# Patient Record
Sex: Female | Born: 1955 | Race: Black or African American | Hispanic: No | Marital: Married | Smoking: Never smoker
Health system: Southern US, Community
[De-identification: ages and names within clinical notes are randomized; demographics above are authoritative.]

## PROBLEM LIST (undated history)

## (undated) DIAGNOSIS — K219 Gastro-esophageal reflux disease without esophagitis: Secondary | ICD-10-CM

## (undated) DIAGNOSIS — IMO0001 Reserved for inherently not codable concepts without codable children: Secondary | ICD-10-CM

## (undated) DIAGNOSIS — I10 Essential (primary) hypertension: Secondary | ICD-10-CM

## (undated) HISTORY — PX: BREAST LUMPECTOMY: SHX2

## (undated) HISTORY — PX: TUBAL LIGATION: SHX77

## (undated) HISTORY — PX: CHOLECYSTECTOMY: SHX55

---

## 2000-01-06 ENCOUNTER — Other Ambulatory Visit: Admission: RE | Admit: 2000-01-06 | Discharge: 2000-01-06 | Payer: Self-pay | Admitting: Family Medicine

## 2000-01-09 ENCOUNTER — Other Ambulatory Visit: Admission: RE | Admit: 2000-01-09 | Discharge: 2000-01-09 | Payer: Self-pay | Admitting: Obstetrics and Gynecology

## 2000-01-17 ENCOUNTER — Ambulatory Visit (HOSPITAL_COMMUNITY): Admission: RE | Admit: 2000-01-17 | Discharge: 2000-01-17 | Payer: Self-pay | Admitting: Obstetrics and Gynecology

## 2001-01-11 ENCOUNTER — Other Ambulatory Visit: Admission: RE | Admit: 2001-01-11 | Discharge: 2001-01-11 | Payer: Self-pay | Admitting: Obstetrics and Gynecology

## 2002-01-05 ENCOUNTER — Other Ambulatory Visit: Admission: RE | Admit: 2002-01-05 | Discharge: 2002-01-05 | Payer: Self-pay | Admitting: Obstetrics and Gynecology

## 2003-01-10 ENCOUNTER — Other Ambulatory Visit: Admission: RE | Admit: 2003-01-10 | Discharge: 2003-01-10 | Payer: Self-pay | Admitting: Obstetrics and Gynecology

## 2004-03-13 ENCOUNTER — Other Ambulatory Visit: Admission: RE | Admit: 2004-03-13 | Discharge: 2004-03-13 | Payer: Self-pay | Admitting: Internal Medicine

## 2004-04-02 ENCOUNTER — Encounter: Admission: RE | Admit: 2004-04-02 | Discharge: 2004-04-02 | Payer: Self-pay | Admitting: Internal Medicine

## 2006-04-16 ENCOUNTER — Encounter: Admission: RE | Admit: 2006-04-16 | Discharge: 2006-04-16 | Payer: Self-pay | Admitting: Internal Medicine

## 2006-05-01 ENCOUNTER — Other Ambulatory Visit: Admission: RE | Admit: 2006-05-01 | Discharge: 2006-05-01 | Payer: Self-pay | Admitting: Internal Medicine

## 2006-05-19 ENCOUNTER — Encounter: Admission: RE | Admit: 2006-05-19 | Discharge: 2006-05-19 | Payer: Self-pay | Admitting: Internal Medicine

## 2010-12-29 ENCOUNTER — Other Ambulatory Visit: Payer: Self-pay

## 2010-12-29 ENCOUNTER — Encounter: Payer: Self-pay | Admitting: Emergency Medicine

## 2010-12-29 ENCOUNTER — Emergency Department (HOSPITAL_BASED_OUTPATIENT_CLINIC_OR_DEPARTMENT_OTHER)
Admission: EM | Admit: 2010-12-29 | Discharge: 2010-12-29 | Disposition: A | Discharge status: Home / Self Care | Payer: BC Managed Care – PPO | Attending: Emergency Medicine | Admitting: Emergency Medicine

## 2010-12-29 DIAGNOSIS — I10 Essential (primary) hypertension: Secondary | ICD-10-CM | POA: Insufficient documentation

## 2010-12-29 DIAGNOSIS — K229 Disease of esophagus, unspecified: Secondary | ICD-10-CM | POA: Insufficient documentation

## 2010-12-29 DIAGNOSIS — R1310 Dysphagia, unspecified: Secondary | ICD-10-CM | POA: Insufficient documentation

## 2010-12-29 HISTORY — DX: Essential (primary) hypertension: I10

## 2010-12-29 MED ORDER — DICYCLOMINE HCL 20 MG PO TABS: 20.0000 mg | ORAL_TABLET | Freq: Two times a day (BID) | ORAL | Status: AC

## 2010-12-29 MED ORDER — GI COCKTAIL ~~LOC~~
30.0000 mL | Freq: Once | ORAL | Status: AC
  Administered 2010-12-29: 30 mL via ORAL
  Filled 2010-12-29: qty 30

## 2010-12-29 NOTE — ED Notes (Signed)
Pt c/o difficulty swallowing since yest; "feels like food is just sitting there"; denies vomiting & pain

## 2010-12-29 NOTE — ED Provider Notes (Signed)
History     CSN: [id number] Arrival date & time: 12/29/2010 11:05 AM  Chief Complaint  Patient presents with  . Dysphagia    (Consider location/radiation/quality/duration/timing/severity/associated sxs/prior treatment) The history is provided by the patient.   patient here with trouble swallowing since yesterday. Patient first noted symptoms when eating a hamburger, denies any actual vomiting is able to swallow her secretions appropriately. The patient denies any prior history of esophageal strictures or food impactions. No fever or diarrhea noted. No medications taken prior to arrival here. Patient feels like the food gets partially stuck about the mid esophageal area.  Past Medical History  Diagnosis Date  . Hypertension     Past Surgical History  Procedure Date  . Tubal ligation   . Breast lumpectomy   . Cholecystectomy     No family history on file.  History  Substance Use Topics  . Smoking status: Never Smoker   . Smokeless tobacco: Not on file  . Alcohol Use: Yes     social    OB History    Grav Para Term Preterm Abortions TAB SAB Ect Mult Living                  Review of Systems  All other systems reviewed and are negative.    Allergies  Review of patient's allergies indicates no known allergies.  Home Medications   Current Outpatient Rx  Name Route Sig Dispense Refill  . LISINOPRIL 10 MG PO TABS Oral Take 10 mg by mouth daily.        BP 143/70  Pulse 62  Temp(Src) 98.5 F (36.9 C) (Oral)  Resp 16  SpO2 100%  Physical Exam  Nursing note and vitals reviewed. Constitutional: She is oriented to person, place, and time. Vital signs are normal. She appears well-developed and well-nourished.  Non-toxic appearance. No distress.  HENT:  Head: Normocephalic and atraumatic.  Mouth/Throat: Uvula is midline and mucous membranes are normal. No oral lesions. No uvula swelling. No oropharyngeal exudate or tonsillar abscesses.  Eyes: Conjunctivae and EOM  are normal. Pupils are equal, round, and reactive to light.  Neck: Normal range of motion. Neck supple. No tracheal deviation present.  Cardiovascular: Normal rate, regular rhythm and normal heart sounds.  Exam reveals no gallop.   No murmur heard. Pulmonary/Chest: Effort normal and breath sounds normal. No stridor. No respiratory distress. She has no wheezes.  Abdominal: Soft. Normal appearance and bowel sounds are normal. She exhibits no distension. There is no tenderness. There is no rebound.  Musculoskeletal: Normal range of motion. She exhibits no edema and no tenderness.  Neurological: She is alert and oriented to person, place, and time. She has normal strength. No cranial nerve deficit or sensory deficit. GCS eye subscore is 4. GCS verbal subscore is 5. GCS motor subscore is 6.  Skin: Skin is warm and dry.  Psychiatric: She has a normal mood and affect. Her speech is normal and behavior is normal.    ED Course  Procedures (including critical care time)  Labs Reviewed - No data to display No results found.   No diagnosis found.    MDM   Date: 12/29/2010  Rate: 63  Rhythm: normal sinus rhythm  QRS Axis: normal  Intervals: normal  ST/T Wave abnormalities: non specific  Conduction Disutrbances:none  Narrative Interpretation:   Old EKG Reviewed: none available   Patient given GI cocktail with minimal relief of her symptoms. Patient states that she feels that when she swallows food  it takes a while for the food to make her stomach. No concern for ACS at this time appears to be a GI related issue. EKG did have some nonspecific T wave changes and patient will followup with her doctor for this       Toy Baker, MD 12/29/10 1236

## 2011-01-13 ENCOUNTER — Other Ambulatory Visit: Payer: Self-pay | Admitting: Obstetrics and Gynecology

## 2011-01-13 ENCOUNTER — Other Ambulatory Visit (HOSPITAL_COMMUNITY)
Admission: RE | Admit: 2011-01-13 | Discharge: 2011-01-13 | Disposition: A | Discharge status: Home / Self Care | Payer: BC Managed Care – PPO | Source: Ambulatory Visit | Attending: Obstetrics and Gynecology | Admitting: Obstetrics and Gynecology

## 2011-01-13 DIAGNOSIS — Z1159 Encounter for screening for other viral diseases: Secondary | ICD-10-CM | POA: Insufficient documentation

## 2011-01-13 DIAGNOSIS — Z124 Encounter for screening for malignant neoplasm of cervix: Secondary | ICD-10-CM | POA: Insufficient documentation

## 2012-02-16 ENCOUNTER — Other Ambulatory Visit (HOSPITAL_BASED_OUTPATIENT_CLINIC_OR_DEPARTMENT_OTHER): Payer: Self-pay | Admitting: Podiatry

## 2012-02-16 DIAGNOSIS — M722 Plantar fascial fibromatosis: Secondary | ICD-10-CM

## 2012-02-24 ENCOUNTER — Ambulatory Visit (HOSPITAL_BASED_OUTPATIENT_CLINIC_OR_DEPARTMENT_OTHER)
Admission: RE | Admit: 2012-02-24 | Discharge: 2012-02-24 | Disposition: A | Discharge status: Home / Self Care | Payer: BC Managed Care – PPO | Source: Ambulatory Visit | Attending: Podiatry | Admitting: Podiatry

## 2012-02-24 ENCOUNTER — Ambulatory Visit (HOSPITAL_BASED_OUTPATIENT_CLINIC_OR_DEPARTMENT_OTHER): Payer: BC Managed Care – PPO

## 2012-02-24 DIAGNOSIS — M19079 Primary osteoarthritis, unspecified ankle and foot: Secondary | ICD-10-CM | POA: Insufficient documentation

## 2012-02-24 DIAGNOSIS — M6580 Other synovitis and tenosynovitis, unspecified site: Secondary | ICD-10-CM | POA: Insufficient documentation

## 2012-02-24 DIAGNOSIS — M722 Plantar fascial fibromatosis: Secondary | ICD-10-CM | POA: Insufficient documentation

## 2012-02-24 DIAGNOSIS — M7730 Calcaneal spur, unspecified foot: Secondary | ICD-10-CM | POA: Insufficient documentation

## 2015-04-22 ENCOUNTER — Emergency Department (HOSPITAL_BASED_OUTPATIENT_CLINIC_OR_DEPARTMENT_OTHER)
Admission: EM | Admit: 2015-04-22 | Discharge: 2015-04-22 | Disposition: A | Discharge status: Home / Self Care | Payer: BLUE CROSS/BLUE SHIELD | Attending: Emergency Medicine | Admitting: Emergency Medicine

## 2015-04-22 ENCOUNTER — Encounter (HOSPITAL_BASED_OUTPATIENT_CLINIC_OR_DEPARTMENT_OTHER): Payer: Self-pay | Admitting: *Deleted

## 2015-04-22 ENCOUNTER — Emergency Department (HOSPITAL_BASED_OUTPATIENT_CLINIC_OR_DEPARTMENT_OTHER): Payer: BLUE CROSS/BLUE SHIELD

## 2015-04-22 DIAGNOSIS — Z792 Long term (current) use of antibiotics: Secondary | ICD-10-CM | POA: Insufficient documentation

## 2015-04-22 DIAGNOSIS — R52 Pain, unspecified: Secondary | ICD-10-CM

## 2015-04-22 DIAGNOSIS — N390 Urinary tract infection, site not specified: Secondary | ICD-10-CM

## 2015-04-22 DIAGNOSIS — R109 Unspecified abdominal pain: Secondary | ICD-10-CM | POA: Diagnosis present

## 2015-04-22 DIAGNOSIS — Z79899 Other long term (current) drug therapy: Secondary | ICD-10-CM | POA: Diagnosis not present

## 2015-04-22 DIAGNOSIS — I10 Essential (primary) hypertension: Secondary | ICD-10-CM | POA: Diagnosis not present

## 2015-04-22 DIAGNOSIS — R809 Proteinuria, unspecified: Secondary | ICD-10-CM | POA: Diagnosis not present

## 2015-04-22 DIAGNOSIS — Z8719 Personal history of other diseases of the digestive system: Secondary | ICD-10-CM | POA: Diagnosis not present

## 2015-04-22 HISTORY — DX: Reserved for inherently not codable concepts without codable children: IMO0001

## 2015-04-22 HISTORY — DX: Gastro-esophageal reflux disease without esophagitis: K21.9

## 2015-04-22 LAB — URINALYSIS, ROUTINE W REFLEX MICROSCOPIC
Bilirubin Urine: NEGATIVE
GLUCOSE, UA: NEGATIVE mg/dL
KETONES UR: NEGATIVE mg/dL
Nitrite: NEGATIVE
PH: 5.5 (ref 5.0–8.0)
Protein, ur: >300 mg/dL — AB
Specific Gravity, Urine: 1.024 (ref 1.005–1.030)

## 2015-04-22 LAB — URINE MICROSCOPIC-ADD ON

## 2015-04-22 MED ORDER — CEPHALEXIN 500 MG PO CAPS: 500.0000 mg | ORAL_CAPSULE | Freq: Four times a day (QID) | ORAL | Status: AC

## 2015-04-22 MED ORDER — DICLOFENAC SODIUM ER 100 MG PO TB24: 100.0000 mg | ORAL_TABLET | Freq: Every day | ORAL | Status: AC

## 2015-04-22 MED ORDER — CEPHALEXIN 250 MG PO CAPS
500.0000 mg | ORAL_CAPSULE | Freq: Once | ORAL | Status: AC
  Administered 2015-04-22: 500 mg via ORAL
  Filled 2015-04-22: qty 2

## 2015-04-22 MED ORDER — KETOROLAC TROMETHAMINE 60 MG/2ML IM SOLN
60.0000 mg | Freq: Once | INTRAMUSCULAR | Status: AC
  Administered 2015-04-22: 60 mg via INTRAMUSCULAR
  Filled 2015-04-22: qty 2

## 2015-04-22 MED ORDER — METHOCARBAMOL 500 MG PO TABS
1000.0000 mg | ORAL_TABLET | Freq: Once | ORAL | Status: AC
  Administered 2015-04-22: 1000 mg via ORAL
  Filled 2015-04-22: qty 2

## 2015-04-22 NOTE — ED Notes (Signed)
MD with pt  

## 2015-04-22 NOTE — ED Notes (Signed)
Transported to CT 

## 2015-04-22 NOTE — ED Provider Notes (Signed)
CSN: [id number]     Arrival date & time 04/22/15  0450 History   First MD Initiated Contact with Patient 04/22/15 0515     Chief Complaint  Patient presents with  . Flank Pain     (Consider location/radiation/quality/duration/timing/severity/associated sxs/prior Treatment) Patient is a 60 y.o. female presenting with flank pain. The history is provided by the patient.  Flank Pain This is a new (actually low left back, no radiation) problem. The current episode started more than 2 days ago. The problem occurs constantly. The problem has not changed since onset.Pertinent negatives include no chest pain, no abdominal pain, no headaches and no shortness of breath. Nothing aggravates the symptoms. Nothing relieves the symptoms. Treatments tried: one dose of cipro. The treatment provided no relief.  History of proteinuria and miscroscopic hematuria worked up at [facility].  No f/c/r.  No vomiting or diarrhea, no cough no congestions.    Past Medical History  Diagnosis Date  . Hypertension   . Reflux    Past Surgical History  Procedure Laterality Date  . Tubal ligation    . Breast lumpectomy    . Cholecystectomy     History reviewed. No pertinent family history. Social History  Substance Use Topics  . Smoking status: Never Smoker   . Smokeless tobacco: None  . Alcohol Use: Yes     Comment: social   OB History    No data available     Review of Systems  Constitutional: Negative for fever.  Respiratory: Negative for cough and shortness of breath.   Cardiovascular: Negative for chest pain, palpitations and leg swelling.  Gastrointestinal: Negative for vomiting, abdominal pain, diarrhea and constipation.  Genitourinary: Positive for flank pain. Negative for dysuria, frequency, hematuria and pelvic pain.       Dark funny smelling urine  Neurological: Negative for headaches.  All other systems reviewed and are negative.     Allergies  Review of patient's allergies indicates no known  allergies.  Home Medications   Prior to Admission medications   Medication Sig Start Date End Date Taking? Authorizing Provider  ciprofloxacin (CIPRO) 500 MG tablet Take 500 mg by mouth 2 (two) times daily.   Yes Historical Provider, MD  diltiazem (CARTIA XT) 180 MG 24 hr capsule Take 180 mg by mouth daily.   Yes Historical Provider, MD  dicyclomine (BENTYL) 20 MG tablet Take 1 tablet (20 mg total) by mouth 2 (two) times daily. 12/29/10 12/29/11  Lorre Nick, MD  lisinopril (PRINIVIL,ZESTRIL) 10 MG tablet Take 10 mg by mouth daily.      Historical Provider, MD   BP 152/86 mmHg  Pulse 83  Temp(Src) 99.4 F (37.4 C)  Resp 18  Ht  (1.727 m)  Wt 282 lb (127.914 kg)  BMI 42.89 kg/m2  SpO2 98% Physical Exam  Constitutional: She is oriented to person, place, and time. She appears well-developed and well-nourished. No distress.  HENT:  Head: Normocephalic and atraumatic.  Mouth/Throat: Oropharynx is clear and moist.  Eyes: Conjunctivae are normal. Pupils are equal, round, and reactive to light.  Neck: Normal range of motion. Neck supple.  Cardiovascular: Normal rate, regular rhythm and intact distal pulses.   Pulmonary/Chest: Effort normal and breath sounds normal. No respiratory distress. She has no wheezes. She has no rales.  Abdominal: Soft. Bowel sounds are normal. There is no tenderness. There is no rebound and no guarding.  Musculoskeletal: Normal range of motion.       Arms: No cva tenderness  Neurological:  She is alert and oriented to person, place, and time.  Skin: Skin is warm and dry.  Psychiatric: Her affect is angry.    ED Course  Procedures (including critical care time) Labs Review Labs Reviewed  URINALYSIS, ROUTINE W REFLEX MICROSCOPIC (NOT AT [facility])    Imaging Review No results found. I have personally reviewed and evaluated these images and lab results as part of my medical decision-making.   EKG Interpretation None      MDM   Final diagnoses:    Pain   Results for orders placed or performed during the hospital encounter of 04/22/15  Urinalysis, Routine w reflex microscopic (not at [facility])  Result Value Ref Range   Color, Urine AMBER (A) YELLOW   APPearance CLOUDY (A) CLEAR   Specific Gravity, Urine 1.024 1.005 - 1.030   pH 5.5 5.0 - 8.0   Glucose, UA NEGATIVE NEGATIVE mg/dL   Hgb urine dipstick LARGE (A) NEGATIVE   Bilirubin Urine NEGATIVE NEGATIVE   Ketones, ur NEGATIVE NEGATIVE mg/dL   Protein, ur >161 (A) NEGATIVE mg/dL   Nitrite NEGATIVE NEGATIVE   Leukocytes, UA SMALL (A) NEGATIVE  Urine microscopic-add on  Result Value Ref Range   Squamous Epithelial / LPF 6-30 (A) NONE SEEN   WBC, UA 6-30 0 - 5 WBC/hpf   RBC / HPF 6-30 0 - 5 RBC/hpf   Bacteria, UA MANY (A) NONE SEEN   Casts HYALINE CASTS (A) NEGATIVE   Urine-Other MUCOUS PRESENT    Ct Renal Stone Study  04/22/2015  CLINICAL DATA:  Acute onset of left lower back pain and nausea. Initial encounter. EXAM: CT ABDOMEN AND PELVIS WITHOUT CONTRAST TECHNIQUE: Multidetector CT imaging of the abdomen and pelvis was performed following the standard protocol without IV contrast. COMPARISON:  Renal ultrasound performed 11/10/2006 FINDINGS: A trace left pleural effusion is noted. Mild left basilar airspace opacity likely reflects atelectasis. The patient is status post reversal of gastric bypass. The liver and spleen are unremarkable in appearance. The patient is status post cholecystectomy, with clips noted at the gallbladder fossa. The pancreas and adrenal glands are unremarkable. The kidneys are unremarkable in appearance. There is no evidence of hydronephrosis. No renal or ureteral stones are seen. No perinephric stranding is appreciated. No free fluid is identified. The small bowel is unremarkable in appearance. The stomach is within normal limits. No acute vascular abnormalities are seen. A tiny right central anterior abdominal wall hernia is suggested just superior to the  umbilicus, containing only fat. The appendix is not definitely characterized; there is no evidence of appendicitis. The colon is partially filled with stool. Scattered diverticulosis noted along the proximal sigmoid colon, without evidence of diverticulitis. The bladder is mildly distended and grossly unremarkable. The uterus is unremarkable in appearance. The ovaries are grossly symmetric. No suspicious adnexal masses are seen. No inguinal lymphadenopathy is seen. No acute osseous abnormalities are identified. Endplate sclerotic change is noted at L5-S1. IMPRESSION: 1. No acute abnormality seen to explain the patient's symptoms. 2. Scattered diverticulosis along the proximal sigmoid colon, without evidence of diverticulitis. 3. Trace left pleural effusion. Mild left basilar airspace opacity likely reflects atelectasis. 4. Tiny right central anterior abdominal wall hernia suggested just superior to the umbilicus, containing only fat. Electronically Signed   By: Roanna Raider M.D.   On: 04/22/2015 05:55    Medications  ketorolac (TORADOL) injection 60 mg (60 mg Intramuscular Given 04/22/15 0528)  methocarbamol (ROBAXIN) tablet 1,000 mg (1,000 mg Oral Given 04/22/15 0526)  cephALEXin (  KEFLEX) capsule 500 mg (500 mg Oral Given 04/22/15 0605)     CT negative for stones or obstruction.  Recent creatinine at [facility] normal (via care everywhere) Stop cipro, start keflex QID x 7 days.  Follow up with your PMD for recheck this week, continue follow up for known proteinuria.  Strict return precautions for weakness fever vomiting or any concerns.  Patient and husband verbalize understanding and agree to follow up   Jazlene Bares, MD 04/22/15 5638

## 2015-04-22 NOTE — ED Notes (Signed)
Pt states that she was seen at Urgent Care and diagnosed with a UTI and started on Cipro. C/o left lower back pain that she describes as constant. States lower back pain started last night. C/o nausea denies vomiting. Denies any urinary symptoms. Denies any fever or chills.

## 2015-04-22 NOTE — ED Notes (Signed)
Returned from CT.

## 2017-08-19 IMAGING — CT CT RENAL STONE PROTOCOL
2 of 4 series · 16 of 46 positions shown, 18 images · non-contrast
Comparison: Renal ultrasound performed 11/10/2006

CLINICAL DATA: Acute onset of left lower back pain and nausea.
Initial encounter.

EXAM:
CT ABDOMEN AND PELVIS WITHOUT CONTRAST
TECHNIQUE: Multidetector CT imaging of the abdomen and pelvis was performed
following the standard protocol without IV contrast.

[Series 2: axial st · axial · 0.75mm/px · z∈[-587,-142]mm · 13 of 97 slices shown, 15 images]
[im 4/97  soft-tissue]
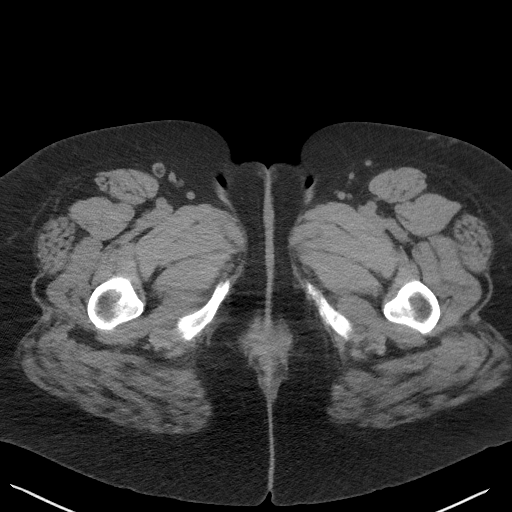
[im 4/97  bone]
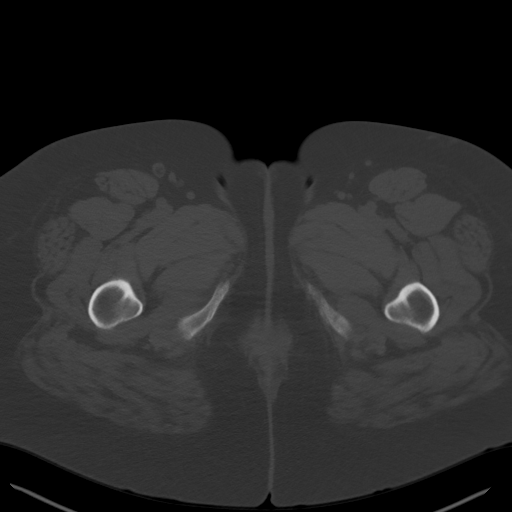
[im 12/97  soft-tissue]
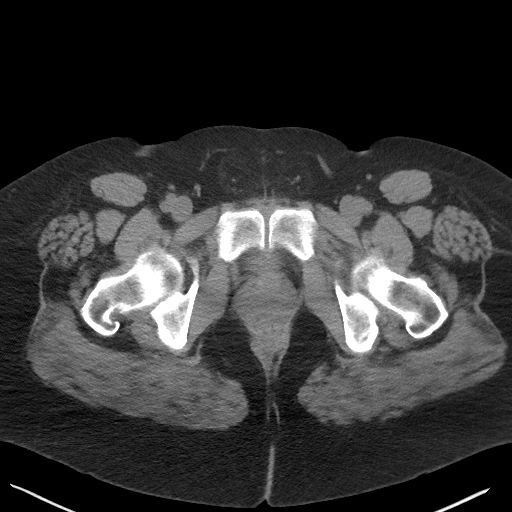
[im 19/97  soft-tissue]
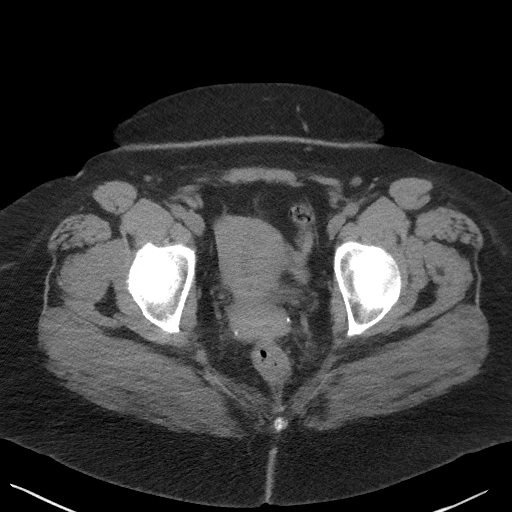
[im 26/97  soft-tissue]
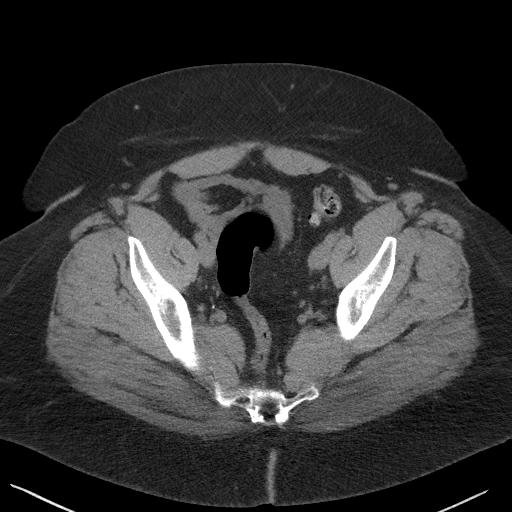
[im 34/97  soft-tissue]
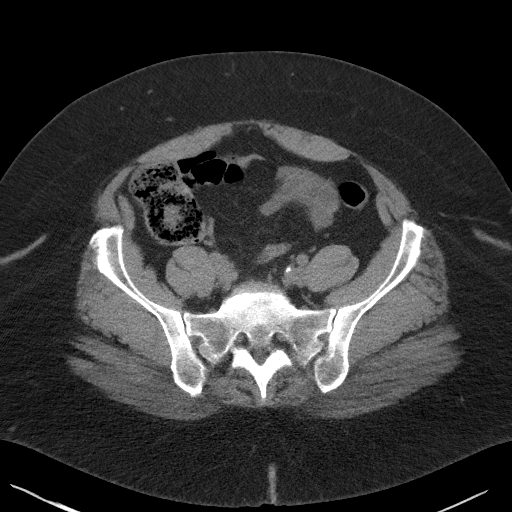
[im 41/97  soft-tissue]
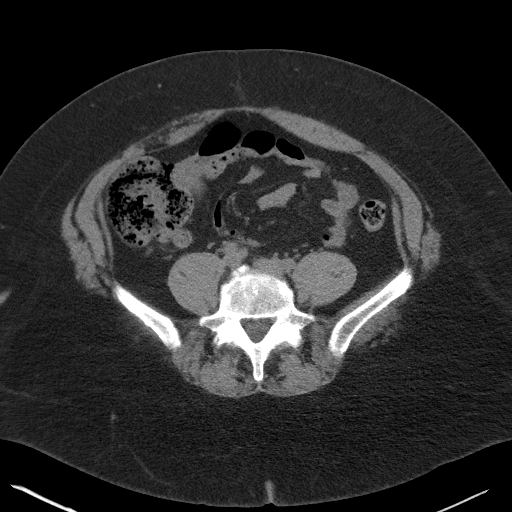
[im 49/97  soft-tissue]
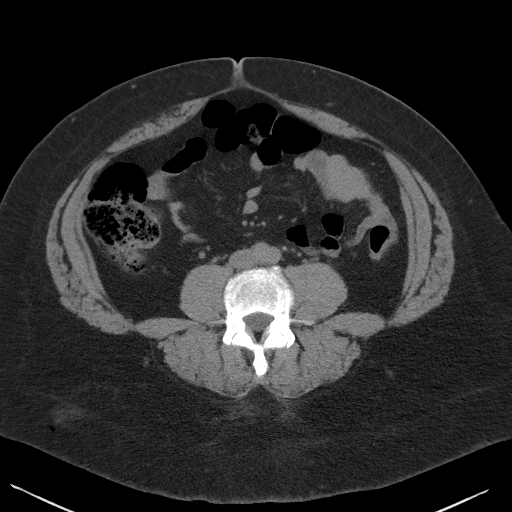
[im 56/97  soft-tissue]
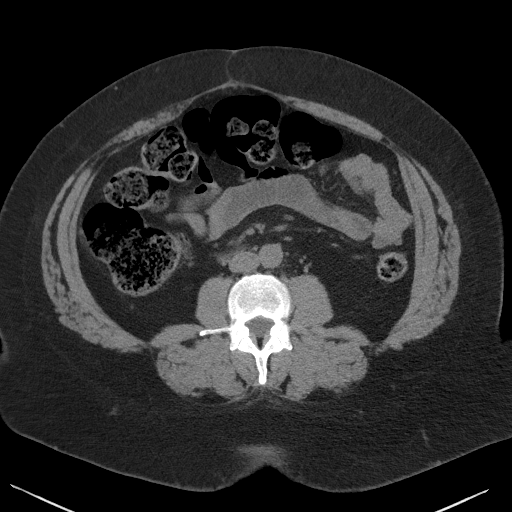
[im 63/97  soft-tissue]
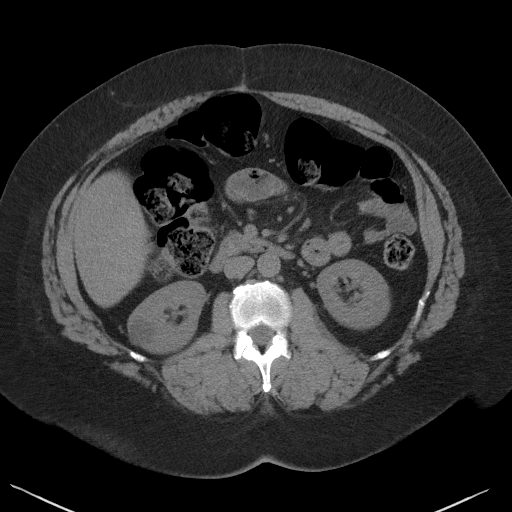
[im 63/97  bone]
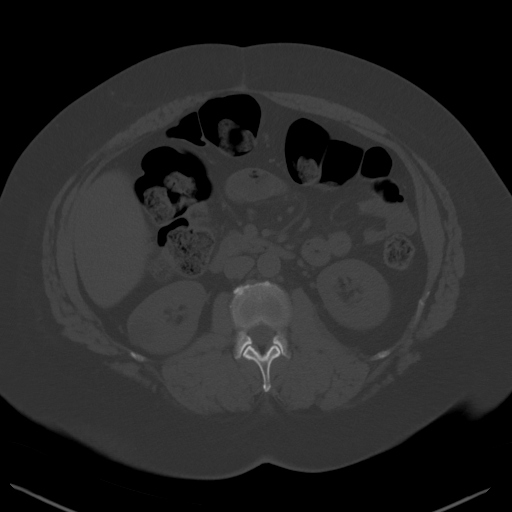
[im 71/97  soft-tissue]
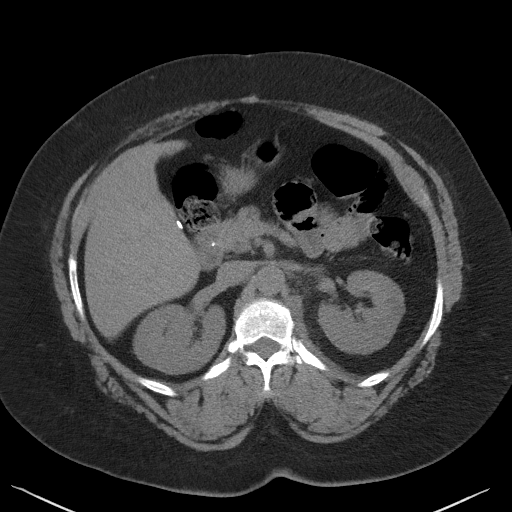
[im 78/97  soft-tissue]
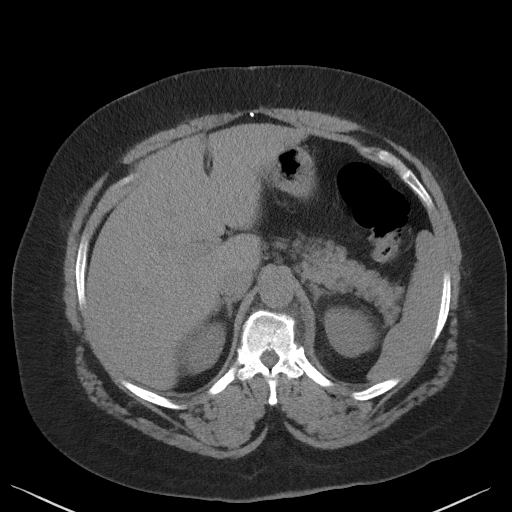
[im 85/97  soft-tissue]
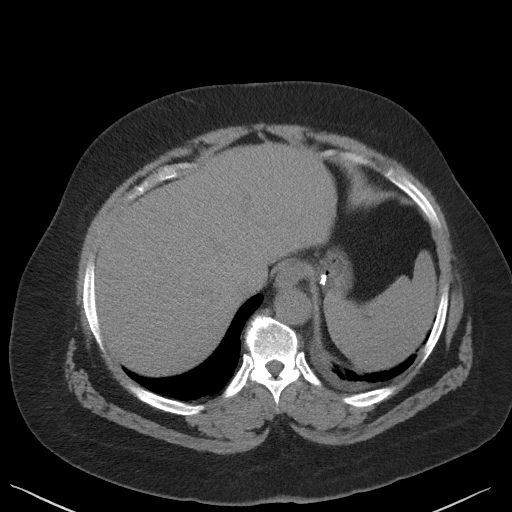
[im 93/97  soft-tissue]
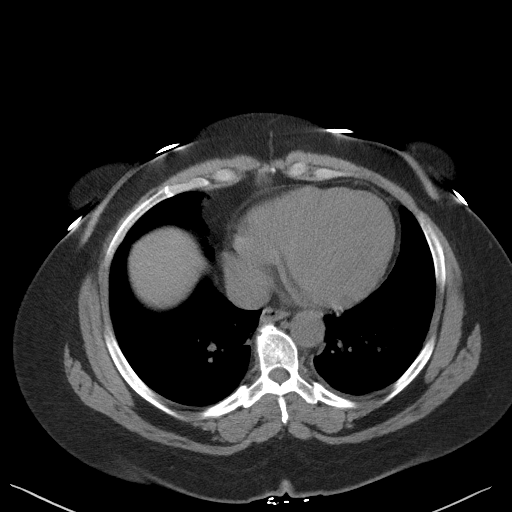

[Series 4: coronal st · coronal · 0.99mm/px · 3 of 115 slices shown]
[im 39/115  soft-tissue]
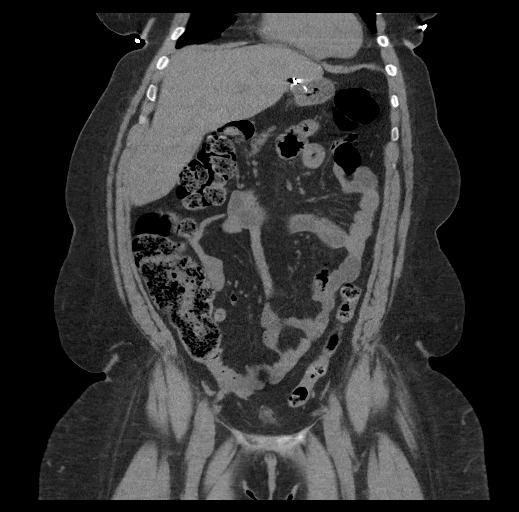
[im 51/115  soft-tissue]
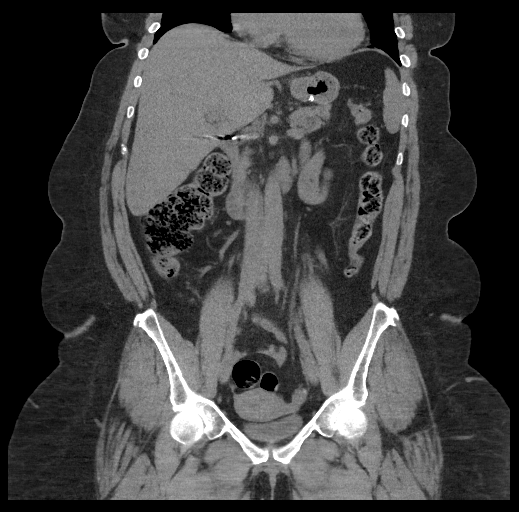
[im 64/115  soft-tissue]
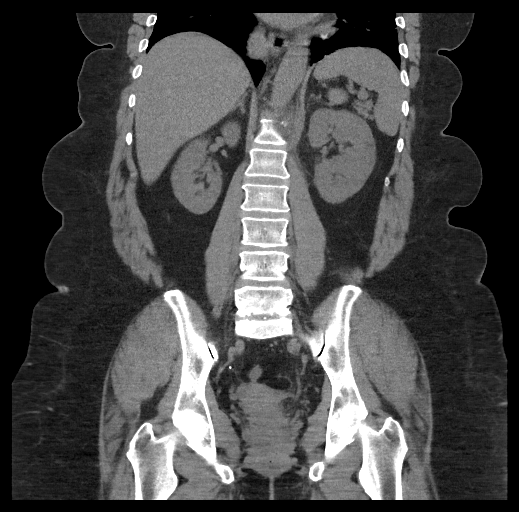

[16 of 46 positions shown; findings below may reference images not displayed]

FINDINGS: A trace left pleural effusion is noted. Mild left basilar airspace
opacity likely reflects atelectasis. The patient is status post
reversal of gastric bypass.

The liver and spleen are unremarkable in appearance. The patient is
status post cholecystectomy, with clips noted at the gallbladder
fossa. The pancreas and adrenal glands are unremarkable.

The kidneys are unremarkable in appearance. There is no evidence of
hydronephrosis. No renal or ureteral stones are seen. No perinephric
stranding is appreciated.

No free fluid is identified. The small bowel is unremarkable in
appearance. The stomach is within normal limits. No acute vascular
abnormalities are seen.

A tiny right central anterior abdominal wall hernia is suggested
just superior to the umbilicus, containing only fat.

The appendix is not definitely characterized; there is no evidence
of appendicitis. The colon is partially filled with stool. Scattered
diverticulosis noted along the proximal sigmoid colon, without
evidence of diverticulitis.

The bladder is mildly distended and grossly unremarkable. The uterus
is unremarkable in appearance. The ovaries are grossly symmetric. No
suspicious adnexal masses are seen. No inguinal lymphadenopathy is
seen.

No acute osseous abnormalities are identified. Endplate sclerotic
change is noted at L5-S1.
IMPRESSION: 1. No acute abnormality seen to explain the patient's symptoms.
2. Scattered diverticulosis along the proximal sigmoid colon,
without evidence of diverticulitis.
3. Trace left pleural effusion. Mild left basilar airspace opacity
likely reflects atelectasis.
4. Tiny right central anterior abdominal wall hernia suggested just
superior to the umbilicus, containing only fat.
